# Patient Record
Sex: Male | Born: 1945 | Race: White | Hispanic: No | Smoking: Never smoker
Health system: Southern US, Community
[De-identification: ages and names within clinical notes are randomized; demographics above are authoritative.]

## PROBLEM LIST (undated history)

## (undated) DIAGNOSIS — F32A Depression, unspecified: Secondary | ICD-10-CM

## (undated) DIAGNOSIS — J449 Chronic obstructive pulmonary disease, unspecified: Secondary | ICD-10-CM

## (undated) DIAGNOSIS — I10 Essential (primary) hypertension: Secondary | ICD-10-CM

## (undated) DIAGNOSIS — G8929 Other chronic pain: Secondary | ICD-10-CM

## (undated) DIAGNOSIS — F329 Major depressive disorder, single episode, unspecified: Secondary | ICD-10-CM

## (undated) DIAGNOSIS — F419 Anxiety disorder, unspecified: Secondary | ICD-10-CM

## (undated) DIAGNOSIS — H547 Unspecified visual loss: Secondary | ICD-10-CM

## (undated) DIAGNOSIS — M549 Dorsalgia, unspecified: Secondary | ICD-10-CM

## (undated) HISTORY — PX: BACK SURGERY: SHX140

## (undated) HISTORY — PX: HAND SURGERY: SHX662

## (undated) HISTORY — PX: TONSILLECTOMY: SUR1361

## (undated) HISTORY — PX: ROTATOR CUFF REPAIR: SHX139

---

## 2002-01-28 ENCOUNTER — Encounter: Payer: Self-pay | Admitting: Emergency Medicine

## 2002-01-28 ENCOUNTER — Emergency Department (HOSPITAL_COMMUNITY): Admission: EM | Admit: 2002-01-28 | Discharge: 2002-01-28 | Payer: Self-pay | Admitting: Emergency Medicine

## 2006-09-19 ENCOUNTER — Emergency Department (HOSPITAL_COMMUNITY): Admission: EM | Admit: 2006-09-19 | Discharge: 2006-09-19 | Payer: Self-pay | Admitting: Emergency Medicine

## 2008-09-09 ENCOUNTER — Encounter
Admission: RE | Admit: 2008-09-09 | Discharge: 2008-12-08 | Payer: Self-pay | Admitting: Physical Medicine & Rehabilitation

## 2008-10-28 ENCOUNTER — Ambulatory Visit: Payer: Self-pay | Admitting: Physical Medicine & Rehabilitation

## 2008-12-02 ENCOUNTER — Ambulatory Visit: Payer: Self-pay | Admitting: Physical Medicine & Rehabilitation

## 2008-12-31 ENCOUNTER — Encounter
Admission: RE | Admit: 2008-12-31 | Discharge: 2009-01-21 | Payer: Self-pay | Admitting: Physical Medicine & Rehabilitation

## 2009-01-01 ENCOUNTER — Ambulatory Visit: Payer: Self-pay | Admitting: Physical Medicine & Rehabilitation

## 2009-02-02 ENCOUNTER — Encounter
Admission: RE | Admit: 2009-02-02 | Discharge: 2009-05-03 | Payer: Self-pay | Admitting: Physical Medicine & Rehabilitation

## 2009-02-03 ENCOUNTER — Ambulatory Visit: Payer: Self-pay | Admitting: Physical Medicine & Rehabilitation

## 2009-03-04 ENCOUNTER — Ambulatory Visit: Payer: Self-pay | Admitting: Physical Medicine & Rehabilitation

## 2009-04-01 ENCOUNTER — Ambulatory Visit: Payer: Self-pay | Admitting: Physical Medicine & Rehabilitation

## 2009-04-27 ENCOUNTER — Ambulatory Visit: Payer: Self-pay | Admitting: Physical Medicine & Rehabilitation

## 2010-11-28 ENCOUNTER — Encounter: Payer: Self-pay | Admitting: *Deleted

## 2010-11-28 ENCOUNTER — Other Ambulatory Visit: Payer: Self-pay

## 2010-11-28 ENCOUNTER — Emergency Department (HOSPITAL_COMMUNITY): Payer: Medicare Other

## 2010-11-28 ENCOUNTER — Emergency Department (HOSPITAL_COMMUNITY)
Admission: EM | Admit: 2010-11-28 | Discharge: 2010-11-28 | Disposition: A | Discharge status: Home / Self Care | Payer: Medicare Other | Attending: Emergency Medicine | Admitting: Emergency Medicine

## 2010-11-28 DIAGNOSIS — G8929 Other chronic pain: Secondary | ICD-10-CM | POA: Insufficient documentation

## 2010-11-28 DIAGNOSIS — I10 Essential (primary) hypertension: Secondary | ICD-10-CM | POA: Insufficient documentation

## 2010-11-28 DIAGNOSIS — R42 Dizziness and giddiness: Secondary | ICD-10-CM | POA: Insufficient documentation

## 2010-11-28 DIAGNOSIS — F19939 Other psychoactive substance use, unspecified with withdrawal, unspecified: Secondary | ICD-10-CM | POA: Insufficient documentation

## 2010-11-28 DIAGNOSIS — R531 Weakness: Secondary | ICD-10-CM

## 2010-11-28 DIAGNOSIS — F1123 Opioid dependence with withdrawal: Secondary | ICD-10-CM

## 2010-11-28 DIAGNOSIS — R5381 Other malaise: Secondary | ICD-10-CM | POA: Insufficient documentation

## 2010-11-28 DIAGNOSIS — J45909 Unspecified asthma, uncomplicated: Secondary | ICD-10-CM | POA: Insufficient documentation

## 2010-11-28 DIAGNOSIS — F1120 Opioid dependence, uncomplicated: Secondary | ICD-10-CM | POA: Insufficient documentation

## 2010-11-28 DIAGNOSIS — F411 Generalized anxiety disorder: Secondary | ICD-10-CM | POA: Insufficient documentation

## 2010-11-28 DIAGNOSIS — H5440 Blindness, one eye, unspecified eye: Secondary | ICD-10-CM | POA: Insufficient documentation

## 2010-11-28 HISTORY — DX: Anxiety disorder, unspecified: F41.9

## 2010-11-28 HISTORY — DX: Other chronic pain: G89.29

## 2010-11-28 HISTORY — DX: Essential (primary) hypertension: I10

## 2010-11-28 HISTORY — DX: Chronic obstructive pulmonary disease, unspecified: J44.9

## 2010-11-28 HISTORY — DX: Unspecified visual loss: H54.7

## 2010-11-28 LAB — RAPID URINE DRUG SCREEN, HOSP PERFORMED
Amphetamines: NOT DETECTED
Barbiturates: NOT DETECTED
Benzodiazepines: POSITIVE — AB
Cocaine: NOT DETECTED
Opiates: NOT DETECTED
Tetrahydrocannabinol: NOT DETECTED

## 2010-11-28 LAB — CBC
Platelets: 213 10*3/uL (ref 150–400)
RBC: 5.07 MIL/uL (ref 4.22–5.81)
RDW: 14 % (ref 11.5–15.5)
WBC: 7 10*3/uL (ref 4.0–10.5)

## 2010-11-28 LAB — DIFFERENTIAL
Basophils Absolute: 0 10*3/uL (ref 0.0–0.1)
Eosinophils Absolute: 0.7 10*3/uL (ref 0.0–0.7)
Eosinophils Relative: 10 % — ABNORMAL HIGH (ref 0–5)
Lymphocytes Relative: 35 % (ref 12–46)
Lymphs Abs: 2.5 10*3/uL (ref 0.7–4.0)
Neutrophils Relative %: 46 % (ref 43–77)

## 2010-11-28 LAB — BASIC METABOLIC PANEL
Calcium: 9 mg/dL (ref 8.4–10.5)
Creatinine, Ser: 1.57 mg/dL — ABNORMAL HIGH (ref 0.50–1.35)
GFR calc non Af Amer: 45 mL/min — ABNORMAL LOW (ref >90)
Sodium: 136 mEq/L (ref 135–145)

## 2010-11-28 LAB — URINALYSIS, ROUTINE W REFLEX MICROSCOPIC
Glucose, UA: NEGATIVE mg/dL
Ketones, ur: NEGATIVE mg/dL
Leukocytes, UA: NEGATIVE
Nitrite: NEGATIVE
Protein, ur: NEGATIVE mg/dL
pH: 5.5 (ref 5.0–8.0)

## 2010-11-28 NOTE — ED Notes (Signed)
Pt stated pain is 10/10, "I just feel weak, my legs give out from under me - if I am on Vicodin I feel good - My doctor took them away from me so I have been buying them on the street".

## 2010-11-28 NOTE — ED Notes (Addendum)
Pt states that "I just don't feel right. I have been feeling like this since I started taking Lyrica and Cymbalta. Dr. Gerilyn Pilgrim took me off my Xanax and hydrocodone last month and then started my new medicine. C/o nausea. States that he could not walk on Saturday and had to crawl." Also c/o dizziness.

## 2010-11-28 NOTE — ED Provider Notes (Signed)
History     CSN: [id number] Arrival date & time: 11/28/2010  7:07 PM    Chief Complaint  Patient presents with  . Dizziness    HPI Pt was seen at 1950.  Per pt, c/o gradual onset and persistence of intermittently "not feeling well" for the past 2 months.  Pt states his symptoms began after his PMD and Pain Management MD took him "off my xanax and vicodin" for his chronic pain.  States he has been taking narcotics for his chronic pain "for the past 40 years."  Describes his symptoms as feeling generally weak, nauseated, "shaky" and having acute flair of his chronic pain "all over my body."  States his symptoms improve when he takes vicodin "that I buy off the street."  Denies vomiting/diarrhea, no CP/SOB, no abd pain, no fevers, no rash, no focal motor weakness, no tingling/numbness in extremities.    Past Medical History  Diagnosis Date  . Chronic pain   . Hypertension   . COPD (chronic obstructive pulmonary disease)   . Asthma   . Anxiety   . Blind     left eye    Past Surgical History  Procedure Date  . Back surgery   . Hand surgery     right  . Rotator cuff repair     right  . Tonsillectomy     History  Substance Use Topics  . Smoking status: Never Smoker   . Smokeless tobacco: Not on file  . Alcohol Use: No      Review of Systems ROS: Statement: All systems negative except as marked or noted in the HPI; Constitutional: Negative for fever and chills. ; ; Eyes: Negative for eye pain, redness and discharge. ; ; ENMT: Negative for ear pain, hoarseness, nasal congestion, sinus pressure and sore throat. ; ; Cardiovascular: Negative for chest pain, palpitations, diaphoresis, dyspnea and peripheral edema. ; ; Respiratory: Negative for cough, wheezing and stridor. ; ; Gastrointestinal: +nausea. Negative for vomiting, diarrhea and abdominal pain, blood in stool, hematemesis, jaundice and rectal bleeding. . ; ; Genitourinary: Negative for dysuria, flank pain and hematuria. ; ;  Musculoskeletal: Negative for back pain and neck pain. Negative for swelling and trauma.; ; Skin: Negative for pruritus, rash, abrasions, blisters, bruising and skin lesion.; ; Neuro: +generalized weakness, shakiness.  Negative for headache, lightheadedness and neck stiffness. Negative for altered level of consciousness , altered mental status, extremity weakness, paresthesias, involuntary movement, seizure and syncope.     Allergies  Review of patient's allergies indicates no known allergies.  Home Medications   Current Outpatient Rx  Name Route Sig Dispense Refill  . DULOXETINE HCL 60 MG PO CPEP Oral Take 60 mg by mouth daily. Had a reaction    . LOSARTAN POTASSIUM-HCTZ 50-12.5 MG PO TABS Oral Take 1 tablet by mouth daily.      . METHYLPREDNISOLONE 4 MG PO KIT Oral Take 4 mg by mouth 2 (two) times daily. Patient stop taking had a reaction     . METHOCARBAMOL 500 MG PO TABS Oral Take 500 mg by mouth 4 (four) times daily. Stop taking had a reaction     . PREGABALIN 75 MG PO CAPS Oral Take 75 mg by mouth 2 (two) times daily. Had a reaction    . TAMSULOSIN HCL 0.4 MG PO CAPS Oral Take 0.4 mg by mouth daily after supper. Had a reaction      BP 124/65  Pulse 83  Temp(Src) 97.4 F (36.3 C) (Oral)  Resp  20  Ht 5' 8.5" (1.74 m)  Wt 180 lb (81.647 kg)  BMI 26.97 kg/m2  SpO2 94%  Physical Exam 1955: Physical examination:  Nursing notes reviewed; Vital signs and O2 SAT reviewed;  Constitutional: Well developed, Well nourished, Well hydrated, In no acute distress; Head:  Normocephalic, atraumatic; Eyes: EOMI, PERRL, No scleral icterus; ENMT: TM's clear bilat, +small superficial abrasion noted on right pinnae, no bleeding or drainage.  Mouth and pharynx normal, Mucous membranes moist; Neck: Supple, Full range of motion, No lymphadenopathy; Cardiovascular: Regular rate and rhythm, No murmur, rub, or gallop; Respiratory: Breath sounds clear & equal bilaterally, No rales, rhonchi, wheezes, or rub,  Normal respiratory effort/excursion; Chest: Nontender, Movement normal; Abdomen: Soft, Nontender, Nondistended, Normal bowel sounds; Genitourinary: No CVA tenderness; Extremities: Pulses normal, No tenderness, No edema, No calf edema or asymmetry.; Neuro: AA&Ox3, Major CN grossly intact.  Speech clear, no facial droop.  Gait steady. No gross focal motor or sensory deficits in extremities.  Gait steady.; Skin: Color normal, Warm, Dry, no rash, no petechiae. Psych:  No SI/HI.    ED Course  Procedures  MDM  MDM Reviewed: nursing note and vitals Interpretation: ECG, labs, x-ray and CT scan    Date: 11/28/2010  Rate: 76  Rhythm: normal sinus rhythm  QRS Axis: normal  Intervals: normal  ST/T Wave abnormalities: normal  Conduction Disutrbances:none  Narrative Interpretation:   Old EKG Reviewed: none available    Results for orders placed during the hospital encounter of 11/28/10  BASIC METABOLIC PANEL      Component Value Range   Sodium 136  135 - 145 (mEq/L)   Potassium 3.8  3.5 - 5.1 (mEq/L)   Chloride 98  96 - 112 (mEq/L)   CO2 32  19 - 32 (mEq/L)   Glucose, Bld 90  70 - 99 (mg/dL)   BUN 23  6 - 23 (mg/dL)   Creatinine, Ser 1.61 (*) 0.50 - 1.35 (mg/dL)   Calcium 9.0  8.4 - 09.6 (mg/dL)   GFR calc non Af Amer 45 (*) >90 (mL/min)   GFR calc Af Amer 52 (*) >90 (mL/min)  CBC      Component Value Range   WBC 7.0  4.0 - 10.5 (K/uL)   RBC 5.07  4.22 - 5.81 (MIL/uL)   Hemoglobin 14.9  13.0 - 17.0 (g/dL)   HCT 04.5  40.9 - 81.1 (%)   MCV 86.6  78.0 - 100.0 (fL)   MCH 29.4  26.0 - 34.0 (pg)   MCHC 33.9  30.0 - 36.0 (g/dL)   RDW 91.4  78.2 - 95.6 (%)   Platelets 213  150 - 400 (K/uL)  DIFFERENTIAL      Component Value Range   Neutrophils Relative 46  43 - 77 (%)   Neutro Abs 3.2  1.7 - 7.7 (K/uL)   Lymphocytes Relative 35  12 - 46 (%)   Lymphs Abs 2.5  0.7 - 4.0 (K/uL)   Monocytes Relative 8  3 - 12 (%)   Monocytes Absolute 0.6  0.1 - 1.0 (K/uL)   Eosinophils Relative 10 (*)  0 - 5 (%)   Eosinophils Absolute 0.7  0.0 - 0.7 (K/uL)   Basophils Relative 1  0 - 1 (%)   Basophils Absolute 0.0  0.0 - 0.1 (K/uL)  URINALYSIS, ROUTINE W REFLEX MICROSCOPIC      Component Value Range   Color, Urine YELLOW  YELLOW    Appearance CLEAR  CLEAR    Specific Gravity, Urine  1.020  1.005 - 1.030    pH 5.5  5.0 - 8.0    Glucose, UA NEGATIVE  NEGATIVE (mg/dL)   Hgb urine dipstick NEGATIVE  NEGATIVE    Bilirubin Urine NEGATIVE  NEGATIVE    Ketones, ur NEGATIVE  NEGATIVE (mg/dL)   Protein, ur NEGATIVE  NEGATIVE (mg/dL)   Urobilinogen, UA 0.2  0.0 - 1.0 (mg/dL)   Nitrite NEGATIVE  NEGATIVE    Leukocytes, UA NEGATIVE  NEGATIVE   URINE RAPID DRUG SCREEN (HOSP PERFORMED)      Component Value Range   Opiates NONE DETECTED  NONE DETECTED    Cocaine NONE DETECTED  NONE DETECTED    Benzodiazepines POSITIVE (*) NONE DETECTED    Amphetamines NONE DETECTED  NONE DETECTED    Tetrahydrocannabinol NONE DETECTED  NONE DETECTED    Barbiturates NONE DETECTED  NONE DETECTED   ETHANOL      Component Value Range   Alcohol, Ethyl (B) <11  0 - 11 (mg/dL)  POCT I-STAT TROPONIN I      Component Value Range   Troponin i, poc 0.01  0.00 - 0.08 (ng/mL)   Comment 3            Dg Chest 2 View  11/28/2010  *RADIOLOGY REPORT*  Clinical Data: Dizziness and weakness.  Rule out infiltrate  CHEST - 2 VIEW  Comparison: 09/19/2006  Findings: Patchy bibasilar airspace disease, not present previously.  This may represent atelectasis or infiltrate. Negative for heart failure or effusion.  Probable COPD.  IMPRESSION: Bibasilar airspace disease, this may represent atelectasis or pneumonia.  Original Report Authenticated By: Camelia Phenes, M.D.   Ct Head Wo Contrast  11/28/2010  *RADIOLOGY REPORT*  Clinical Data: Dizziness and weakness  CT HEAD WITHOUT CONTRAST  Technique:  Contiguous axial images were obtained from the base of the skull through the vertex without contrast.  Comparison: None.  Findings: Age  appropriate atrophy.  Negative for hydrocephalus. Negative for acute infarct.  Negative for hemorrhage or mass lesion.  Calvarium is intact.  IMPRESSION: No acute intracranial abnormality.  Original Report Authenticated By: Camelia Phenes, M.D.   10:48 PM:  Doubt pneumonia on CXR given normal WBC, afebrile, no c/o cough or fevers at home.  Pt up walking around ED exam room, wants to go home now.  Does not want detox for his narcotic dependence, stating "vicodin makes me feel good" and he does not want to stop taking it.  Dx testing d/w pt.  Questions answered.  Verb understanding, agreeable to d/c home with outpt f/u.   Katerin Negrete Allison Quarry, DO 12/01/10 1844

## 2010-11-28 NOTE — ED Notes (Signed)
Pt back in room from radiology.

## 2010-11-28 NOTE — ED Notes (Signed)
Pt out to radiology

## 2010-11-28 NOTE — ED Notes (Signed)
Pt up to bathroom.

## 2010-11-28 NOTE — ED Notes (Signed)
MD at bedside to reassess pt

## 2010-11-28 NOTE — ED Notes (Signed)
MD at bedside. 

## 2010-11-30 LAB — URINE CULTURE: Colony Count: NO GROWTH

## 2011-10-24 ENCOUNTER — Other Ambulatory Visit (HOSPITAL_COMMUNITY): Payer: Self-pay | Admitting: Family Medicine

## 2011-10-24 ENCOUNTER — Ambulatory Visit (HOSPITAL_COMMUNITY)
Admission: RE | Admit: 2011-10-24 | Discharge: 2011-10-24 | Disposition: A | Discharge status: Home / Self Care | Payer: Medicare Other | Source: Ambulatory Visit | Attending: Family Medicine | Admitting: Family Medicine

## 2011-10-24 DIAGNOSIS — R0600 Dyspnea, unspecified: Secondary | ICD-10-CM

## 2011-10-24 DIAGNOSIS — J40 Bronchitis, not specified as acute or chronic: Secondary | ICD-10-CM

## 2011-10-24 DIAGNOSIS — R0609 Other forms of dyspnea: Secondary | ICD-10-CM | POA: Insufficient documentation

## 2011-10-24 DIAGNOSIS — R0989 Other specified symptoms and signs involving the circulatory and respiratory systems: Secondary | ICD-10-CM | POA: Insufficient documentation

## 2011-11-06 ENCOUNTER — Encounter (INDEPENDENT_AMBULATORY_CARE_PROVIDER_SITE_OTHER): Payer: Self-pay | Admitting: *Deleted

## 2011-11-15 ENCOUNTER — Encounter (INDEPENDENT_AMBULATORY_CARE_PROVIDER_SITE_OTHER): Payer: Self-pay | Admitting: *Deleted

## 2011-11-15 ENCOUNTER — Telehealth (INDEPENDENT_AMBULATORY_CARE_PROVIDER_SITE_OTHER): Payer: Self-pay | Admitting: *Deleted

## 2011-11-15 ENCOUNTER — Other Ambulatory Visit (INDEPENDENT_AMBULATORY_CARE_PROVIDER_SITE_OTHER): Payer: Self-pay | Admitting: *Deleted

## 2011-11-15 DIAGNOSIS — Z860100 Personal history of colon polyps, unspecified: Secondary | ICD-10-CM

## 2011-11-15 DIAGNOSIS — Z1211 Encounter for screening for malignant neoplasm of colon: Secondary | ICD-10-CM

## 2011-11-15 MED ORDER — PEG-KCL-NACL-NASULF-NA ASC-C 100 G PO SOLR: 1.0000 | Freq: Once | ORAL | Status: DC

## 2011-11-15 NOTE — Telephone Encounter (Signed)
Patient needs movi prep 

## 2011-12-20 ENCOUNTER — Telehealth (INDEPENDENT_AMBULATORY_CARE_PROVIDER_SITE_OTHER): Payer: Self-pay | Admitting: *Deleted

## 2011-12-20 NOTE — Telephone Encounter (Signed)
  Procedure: tcs  Reason/Indication:  Hx polyps  Has patient had this procedure before?  yes  If so, when, by whom and where?  Less than 10 yrs  Is there a family history of colon cancer?  no  Who?  What age when diagnosed?    Is patient diabetic?   no      Does patient have prosthetic heart valve?  no  Do you have a pacemaker?  no  Has patient had joint replacement within last 12 months?  no  Is patient on Coumadin, Plavix and/or Aspirin? no  Medications: tramadol 50 mg 1-2 tab Q 6 hrs, crestor 20 mg daily, ranitidine 150 mg bid, hydroxyzine 25 mg tid, tamsulosine 0.4 mg daily, diclofenac 50 mg 1 tab Q 12 hrs, losartan/hctz 50/12.5 mg daily, xanax 1 mg 1 tab Q 4 hrs, pro-air inhaler 90 mcg  Allergies: steroids  Medication Adjustment:   Procedure date & time: 01/17/12 at 1200

## 2011-12-20 NOTE — Telephone Encounter (Signed)
agree

## 2011-12-27 ENCOUNTER — Encounter (HOSPITAL_COMMUNITY): Payer: Self-pay | Admitting: Pharmacy Technician

## 2012-01-02 ENCOUNTER — Encounter (HOSPITAL_COMMUNITY): Payer: Self-pay | Admitting: Pharmacy Technician

## 2012-02-15 ENCOUNTER — Telehealth (INDEPENDENT_AMBULATORY_CARE_PROVIDER_SITE_OTHER): Payer: Self-pay | Admitting: *Deleted

## 2012-02-15 NOTE — Telephone Encounter (Signed)
  Procedure: tcs  Reason/Indication:  Hx polyps  Has patient had this procedure before?  yes  If so, when, by whom and where?  Less than 10 yrs ago  Is there a family history of colon cancer?  np  Who?  What age when diagnosed?    Is patient diabetic?   no      Does patient have prosthetic heart valve?  no  Do you have a pacemaker?  no  Has patient had joint replacement within last 12 months?  no  Is patient on Coumadin, Plavix and/or Aspirin? no  Medications: tramadol 50 mg 1-2 tab Q 6 hrs, crestor 20 mg daily, ranitidine 150 mg bid, hydroxyzine 25 mg tid, tamsulosine 0.4 mg daily, diclofenac 50 mg 1 tab Q 12 hrs, losartan/hctz 50/12.5 mg daily, xanax 1 mg 1 tab Q 4 hrs, pro-air inhaler 90 mcg  Allergies: steroids  Medication Adjustment:   Procedure date & time: 03/13/12 at 930

## 2012-02-15 NOTE — Telephone Encounter (Signed)
agree

## 2012-03-12 ENCOUNTER — Other Ambulatory Visit (INDEPENDENT_AMBULATORY_CARE_PROVIDER_SITE_OTHER): Payer: Self-pay | Admitting: *Deleted

## 2012-03-12 ENCOUNTER — Encounter (INDEPENDENT_AMBULATORY_CARE_PROVIDER_SITE_OTHER): Payer: Self-pay | Admitting: *Deleted

## 2012-03-12 DIAGNOSIS — Z86010 Personal history of colonic polyps: Secondary | ICD-10-CM

## 2012-03-20 ENCOUNTER — Telehealth (INDEPENDENT_AMBULATORY_CARE_PROVIDER_SITE_OTHER): Payer: Self-pay | Admitting: *Deleted

## 2012-03-20 NOTE — Telephone Encounter (Signed)
agree

## 2012-03-20 NOTE — Telephone Encounter (Signed)
  Procedure: tcs  Reason/Indication:  Hx polyps  Has patient had this procedure before?  Yes, less than 10 yrs ago   If so, when, by whom and where?    Is there a family history of colon cancer?  no  Who?  What age when diagnosed?    Is patient diabetic?   no      Does patient have prosthetic heart valve?  no  Do you have a pacemaker?  no  Has patient had joint replacement within last 12 months?  no  Is patient on Coumadin, Plavix and/or Aspirin? no  Medications: tramadol 50 mg 1-2 tab Q 6 hours, crestor 20 mg daily, ranitidine 150 mg bid, hydroxyzine 25 mg tid, tamsulosin 0.4 mg daily, diclofenac 50 mg tab 1 tab Q 12 hours, losartan/hctz 50/12.5 mg daily, xanax 1 mg 1 tab Q 4 hours, pro-air inhaler 90 mcg  Allergies: steroids  Medication Adjustment:   Procedure date & time: 04/18/12 at 1030

## 2012-04-17 MED ORDER — SODIUM CHLORIDE 0.45 % IV SOLN: INTRAVENOUS | Status: DC

## 2012-04-18 ENCOUNTER — Ambulatory Visit (HOSPITAL_COMMUNITY)
Admission: RE | Admit: 2012-04-18 | Discharge: 2012-04-18 | Disposition: A | Discharge status: Home / Self Care | Payer: Medicare Other | Source: Ambulatory Visit | Attending: Internal Medicine | Admitting: Internal Medicine

## 2012-04-18 ENCOUNTER — Encounter (HOSPITAL_COMMUNITY): Payer: Self-pay | Admitting: *Deleted

## 2012-04-18 ENCOUNTER — Encounter (HOSPITAL_COMMUNITY)
Admission: RE | Disposition: A | Discharge status: Home / Self Care | Payer: Self-pay | Source: Ambulatory Visit | Attending: Internal Medicine

## 2012-04-18 DIAGNOSIS — D126 Benign neoplasm of colon, unspecified: Secondary | ICD-10-CM | POA: Insufficient documentation

## 2012-04-18 DIAGNOSIS — D128 Benign neoplasm of rectum: Secondary | ICD-10-CM | POA: Insufficient documentation

## 2012-04-18 DIAGNOSIS — I10 Essential (primary) hypertension: Secondary | ICD-10-CM | POA: Insufficient documentation

## 2012-04-18 DIAGNOSIS — J4489 Other specified chronic obstructive pulmonary disease: Secondary | ICD-10-CM | POA: Insufficient documentation

## 2012-04-18 DIAGNOSIS — H5440 Blindness, one eye, unspecified eye: Secondary | ICD-10-CM | POA: Insufficient documentation

## 2012-04-18 DIAGNOSIS — M549 Dorsalgia, unspecified: Secondary | ICD-10-CM | POA: Insufficient documentation

## 2012-04-18 DIAGNOSIS — Z860100 Personal history of colon polyps, unspecified: Secondary | ICD-10-CM

## 2012-04-18 DIAGNOSIS — G8929 Other chronic pain: Secondary | ICD-10-CM | POA: Insufficient documentation

## 2012-04-18 DIAGNOSIS — F329 Major depressive disorder, single episode, unspecified: Secondary | ICD-10-CM | POA: Insufficient documentation

## 2012-04-18 DIAGNOSIS — F411 Generalized anxiety disorder: Secondary | ICD-10-CM | POA: Insufficient documentation

## 2012-04-18 DIAGNOSIS — J449 Chronic obstructive pulmonary disease, unspecified: Secondary | ICD-10-CM | POA: Insufficient documentation

## 2012-04-18 DIAGNOSIS — F3289 Other specified depressive episodes: Secondary | ICD-10-CM | POA: Insufficient documentation

## 2012-04-18 DIAGNOSIS — D129 Benign neoplasm of anus and anal canal: Secondary | ICD-10-CM

## 2012-04-18 DIAGNOSIS — K644 Residual hemorrhoidal skin tags: Secondary | ICD-10-CM | POA: Insufficient documentation

## 2012-04-18 DIAGNOSIS — Z1211 Encounter for screening for malignant neoplasm of colon: Secondary | ICD-10-CM

## 2012-04-18 DIAGNOSIS — Z79899 Other long term (current) drug therapy: Secondary | ICD-10-CM | POA: Insufficient documentation

## 2012-04-18 HISTORY — DX: Major depressive disorder, single episode, unspecified: F32.9

## 2012-04-18 HISTORY — DX: Dorsalgia, unspecified: M54.9

## 2012-04-18 HISTORY — DX: Other chronic pain: G89.29

## 2012-04-18 HISTORY — PX: COLONOSCOPY: SHX5424

## 2012-04-18 HISTORY — DX: Depression, unspecified: F32.A

## 2012-04-18 SURGERY — COLONOSCOPY
Anesthesia: Moderate Sedation

## 2012-04-18 MED ORDER — MEPERIDINE HCL 50 MG/ML IJ SOLN
INTRAMUSCULAR | Status: DC | PRN
  Administered 2012-04-18 (×2): 25 mg via INTRAVENOUS

## 2012-04-18 MED ORDER — MEPERIDINE HCL 50 MG/ML IJ SOLN
INTRAMUSCULAR | Status: AC
  Filled 2012-04-18: qty 1

## 2012-04-18 MED ORDER — MIDAZOLAM HCL 5 MG/5ML IJ SOLN
INTRAMUSCULAR | Status: AC
  Filled 2012-04-18: qty 10

## 2012-04-18 MED ORDER — MIDAZOLAM HCL 5 MG/5ML IJ SOLN
INTRAMUSCULAR | Status: DC | PRN
  Administered 2012-04-18 (×3): 2 mg via INTRAVENOUS
  Administered 2012-04-18 (×3): 3 mg via INTRAVENOUS

## 2012-04-18 MED ORDER — MIDAZOLAM HCL 5 MG/5ML IJ SOLN
INTRAMUSCULAR | Status: AC
  Filled 2012-04-18: qty 5

## 2012-04-18 MED ORDER — PROMETHAZINE HCL 25 MG/ML IJ SOLN
INTRAMUSCULAR | Status: DC | PRN
  Administered 2012-04-18: 25 mg via INTRAVENOUS

## 2012-04-18 MED ORDER — SODIUM CHLORIDE 0.9 % IJ SOLN
INTRAMUSCULAR | Status: AC
  Filled 2012-04-18: qty 10

## 2012-04-18 MED ORDER — PROMETHAZINE HCL 25 MG/ML IJ SOLN
INTRAMUSCULAR | Status: AC
  Filled 2012-04-18: qty 1

## 2012-04-18 MED ORDER — SODIUM CHLORIDE 0.9 % IV SOLN
INTRAVENOUS | Status: DC
  Administered 2012-04-18: 10:00:00 via INTRAVENOUS

## 2012-04-18 NOTE — H&P (Signed)
Steven Ayala is an 67 y.o. male.   Chief Complaint: Patient is here for colonoscopy. HPI: Patient 67 year old Caucasian male with history of colonic polyps and is here for surveillance colonoscopy. He had polyps removed on his first colonoscopy but 15 years ago none on his last exam which is +10 years ago elsewhere. He denies abdominal pain change in his bowel habits or rectal bleeding. Family history is negative for CRC.  Past Medical History  Diagnosis Date  . Chronic pain   . Hypertension   . COPD (chronic obstructive pulmonary disease)   . Asthma   . Anxiety   . Blind     left eye  . Depression   . Chronic back pain     Past Surgical History  Procedure Laterality Date  . Back surgery    . Hand surgery      right  . Rotator cuff repair      right  . Tonsillectomy      Family History  Problem Relation Age of Onset  . Colon cancer Neg Hx    Social History:  reports that he has never smoked. He does not have any smokeless tobacco history on file. He reports that he uses illicit drugs. He reports that he does not drink alcohol.  Allergies: No Known Allergies  Medications Prior to Admission  Medication Sig Dispense Refill  . ALPRAZolam (XANAX) 1 MG tablet Take 1 mg by mouth 3 (three) times daily as needed.      Marland Kitchen amLODipine (NORVASC) 5 MG tablet Take 5 mg by mouth daily.      . DULoxetine (CYMBALTA) 60 MG capsule Take 60 mg by mouth daily.       Marland Kitchen HYDROcodone-acetaminophen (NORCO) 10-325 MG per tablet Take 1 tablet by mouth every 6 (six) hours as needed. Pain      . losartan-hydrochlorothiazide (HYZAAR) 50-12.5 MG per tablet Take 1 tablet by mouth daily.        . methocarbamol (ROBAXIN) 500 MG tablet Take 500 mg by mouth 4 (four) times daily.       . polyethylene glycol (GOLYTELY/NULYTELY) 236 G solution Take by mouth once.      . ranitidine (ZANTAC) 150 MG tablet Take 150 mg by mouth 2 (two) times daily.      . Tamsulosin HCl (FLOMAX) 0.4 MG CAPS Take 0.4 mg by mouth  daily after supper.       Marland Kitchen albuterol (PROVENTIL HFA;VENTOLIN HFA) 108 (90 BASE) MCG/ACT inhaler Inhale 2 puffs into the lungs every 6 (six) hours as needed. Shortness of Breath      . peg 3350 powder (MOVIPREP) 100 G SOLR Take 1 kit (100 g total) by mouth once.  1 kit  0    No results found for this or any previous visit (from the past 48 hour(s)). No results found.  ROS  Blood pressure 151/79, pulse 74, temperature 97.5 F (36.4 C), temperature source Oral, resp. rate 22, height 5\' 8"  (1.727 m), weight 185 lb (83.915 kg), SpO2 93.00%. Physical Exam  Constitutional: He appears well-developed and well-nourished.  HENT:  Mouth/Throat: Oropharynx is clear and moist.  Eyes: Conjunctivae are normal. No scleral icterus.  Neck: No thyromegaly present.  Cardiovascular: Normal rate, regular rhythm and normal heart sounds.   No murmur heard. Respiratory: Effort normal and breath sounds normal.  GI: Soft. He exhibits no distension and no mass. There is no tenderness.  Musculoskeletal: He exhibits no edema.  Lymphadenopathy:    He has  no cervical adenopathy.  Neurological: He is alert.  Skin: Skin is warm and dry.     Assessment/Plan History of colonic polyps. Surveillance colonoscopy.  Gionna Polak U 04/18/2012, 10:48 AM

## 2012-04-18 NOTE — Op Note (Signed)
COLONOSCOPY PROCEDURE REPORT  PATIENT:  Steven Ayala  MR#:  [id number] Birthdate:  25-Apr-1945, 67 y.o., male Endoscopist:  Dr. Malissa Hippo, MD Referred By:  Dr. Ishmael Holter. McInnis, and Procedure Date: 04/18/2012  Procedure:   Colonoscopy with snare polypectomy  Indications: Patient is 67 year old Caucasian male with history of colonic adenomas. He is undergoing surveillance colonoscopy.  Informed Consent:  The procedure and risks were reviewed with the patient and informed consent was obtained.  Medications:  Demerol 50 mg IV Versed 15 mg IV Promethazine 25 mg IV in diluted form.  Description of procedure:  After a digital rectal exam was performed, that colonoscope was advanced from the anus through the rectum and colon to the area of the cecum, ileocecal valve and appendiceal orifice. The cecum was deeply intubated. These structures were well-seen and photographed for the record. From the level of the cecum and ileocecal valve, the scope was slowly and cautiously withdrawn. The mucosal surfaces were carefully surveyed utilizing scope tip to flexion to facilitate fold flattening as needed. The scope was pulled down into the rectum where a thorough exam including retroflexion was performed.  Findings:   Prep satisfactory. Small polyp ablated via cold biopsy from mid transverse colon. 12  mm polyp snared from rectum. Small hemorrhoids below the dentate line.   Therapeutic/Diagnostic Maneuvers Performed:  See above  Complications:  None  Cecal Withdrawal Time:  13 minutes  Impression:  Examination performed to cecum. Small polyp ablated via cold biopsy from the transverse colon. 12 mm polyp snared from rectum. Small external hemorrhoids.  Recommendations:  Standard instructions given. I will contact patient with biopsy results and further recommendations.  REHMAN,NAJEEB U  04/18/2012 11:38 AM  CC: Dr. Alice Reichert, MD & Dr. Bonnetta Barry ref. provider found

## 2012-04-22 ENCOUNTER — Encounter (INDEPENDENT_AMBULATORY_CARE_PROVIDER_SITE_OTHER): Payer: Self-pay | Admitting: *Deleted

## 2012-04-22 ENCOUNTER — Encounter (HOSPITAL_COMMUNITY): Payer: Self-pay | Admitting: Internal Medicine

## 2012-04-23 ENCOUNTER — Encounter (INDEPENDENT_AMBULATORY_CARE_PROVIDER_SITE_OTHER): Payer: Self-pay | Admitting: *Deleted

## 2013-04-11 ENCOUNTER — Other Ambulatory Visit (HOSPITAL_COMMUNITY): Payer: Self-pay | Admitting: Family Medicine

## 2013-04-11 ENCOUNTER — Ambulatory Visit (HOSPITAL_COMMUNITY)
Admission: RE | Admit: 2013-04-11 | Discharge: 2013-04-11 | Disposition: A | Discharge status: Home / Self Care | Payer: Medicare Other | Source: Ambulatory Visit | Attending: Family Medicine | Admitting: Family Medicine

## 2013-04-11 DIAGNOSIS — M79605 Pain in left leg: Principal | ICD-10-CM

## 2013-04-11 DIAGNOSIS — R609 Edema, unspecified: Secondary | ICD-10-CM

## 2013-04-11 DIAGNOSIS — M79604 Pain in right leg: Secondary | ICD-10-CM

## 2013-04-11 DIAGNOSIS — M79609 Pain in unspecified limb: Secondary | ICD-10-CM | POA: Insufficient documentation

## 2013-07-02 ENCOUNTER — Ambulatory Visit (HOSPITAL_COMMUNITY)
Admission: RE | Admit: 2013-07-02 | Discharge: 2013-07-02 | Disposition: A | Discharge status: Home / Self Care | Payer: Medicare Other | Source: Ambulatory Visit | Attending: Family Medicine | Admitting: Family Medicine

## 2013-07-02 ENCOUNTER — Other Ambulatory Visit (HOSPITAL_COMMUNITY): Payer: Self-pay | Admitting: Family Medicine

## 2013-07-02 DIAGNOSIS — M542 Cervicalgia: Secondary | ICD-10-CM

## 2013-07-02 DIAGNOSIS — M47812 Spondylosis without myelopathy or radiculopathy, cervical region: Secondary | ICD-10-CM | POA: Insufficient documentation

## 2013-09-18 ENCOUNTER — Other Ambulatory Visit (HOSPITAL_COMMUNITY): Payer: Self-pay | Admitting: Family Medicine

## 2013-09-18 ENCOUNTER — Ambulatory Visit (HOSPITAL_COMMUNITY)
Admission: RE | Admit: 2013-09-18 | Discharge: 2013-09-18 | Disposition: A | Discharge status: Home / Self Care | Payer: Medicare Other | Source: Ambulatory Visit | Attending: Family Medicine | Admitting: Family Medicine

## 2013-09-18 DIAGNOSIS — R1011 Right upper quadrant pain: Secondary | ICD-10-CM

## 2013-09-18 DIAGNOSIS — I7090 Unspecified atherosclerosis: Secondary | ICD-10-CM | POA: Diagnosis not present

## 2013-09-18 DIAGNOSIS — R1909 Other intra-abdominal and pelvic swelling, mass and lump: Secondary | ICD-10-CM | POA: Insufficient documentation

## 2013-09-18 DIAGNOSIS — R932 Abnormal findings on diagnostic imaging of liver and biliary tract: Secondary | ICD-10-CM | POA: Diagnosis not present

## 2013-09-18 DIAGNOSIS — R1901 Right upper quadrant abdominal swelling, mass and lump: Secondary | ICD-10-CM

## 2013-09-18 MED ORDER — IOHEXOL 300 MG/ML  SOLN
100.0000 mL | Freq: Once | INTRAMUSCULAR | Status: AC | PRN
  Administered 2013-09-18: 100 mL via INTRAVENOUS

## 2013-09-23 ENCOUNTER — Other Ambulatory Visit (HOSPITAL_COMMUNITY): Payer: Self-pay | Admitting: General Surgery

## 2013-09-23 DIAGNOSIS — R1011 Right upper quadrant pain: Secondary | ICD-10-CM

## 2013-09-29 ENCOUNTER — Ambulatory Visit (HOSPITAL_COMMUNITY)
Admission: RE | Admit: 2013-09-29 | Discharge: 2013-09-29 | Disposition: A | Discharge status: Home / Self Care | Payer: Medicare Other | Source: Ambulatory Visit | Attending: General Surgery | Admitting: General Surgery

## 2013-09-29 DIAGNOSIS — R1011 Right upper quadrant pain: Secondary | ICD-10-CM | POA: Diagnosis not present

## 2013-10-02 ENCOUNTER — Other Ambulatory Visit (HOSPITAL_COMMUNITY): Payer: Self-pay | Admitting: General Surgery

## 2013-10-02 DIAGNOSIS — K829 Disease of gallbladder, unspecified: Secondary | ICD-10-CM

## 2013-10-02 DIAGNOSIS — R110 Nausea: Secondary | ICD-10-CM

## 2013-10-02 DIAGNOSIS — R1011 Right upper quadrant pain: Secondary | ICD-10-CM

## 2013-10-07 ENCOUNTER — Encounter (HOSPITAL_COMMUNITY): Payer: Self-pay

## 2013-10-07 ENCOUNTER — Encounter (HOSPITAL_COMMUNITY)
Admission: RE | Admit: 2013-10-07 | Discharge: 2013-10-07 | Disposition: A | Discharge status: Home / Self Care | Payer: Medicare Other | Source: Ambulatory Visit | Attending: Diagnostic Radiology | Admitting: Diagnostic Radiology

## 2013-10-07 DIAGNOSIS — R1011 Right upper quadrant pain: Secondary | ICD-10-CM | POA: Diagnosis present

## 2013-10-07 DIAGNOSIS — R110 Nausea: Secondary | ICD-10-CM | POA: Diagnosis not present

## 2013-10-07 DIAGNOSIS — K829 Disease of gallbladder, unspecified: Secondary | ICD-10-CM

## 2013-10-07 MED ORDER — SODIUM CHLORIDE 0.9 % IJ SOLN
INTRAMUSCULAR | Status: AC
  Filled 2013-10-07: qty 30

## 2013-10-07 MED ORDER — TECHNETIUM TC 99M MEBROFENIN IV KIT
5.0000 | PACK | Freq: Once | INTRAVENOUS | Status: AC | PRN
  Administered 2013-10-07: 5 via INTRAVENOUS

## 2013-10-07 MED ORDER — STERILE WATER FOR INJECTION IJ SOLN
INTRAMUSCULAR | Status: AC
  Administered 2013-10-07: 5 mL via INTRAVENOUS
  Filled 2013-10-07: qty 10

## 2013-10-07 MED ORDER — SINCALIDE 5 MCG IJ SOLR
INTRAMUSCULAR | Status: AC
  Administered 2013-10-07: 1.68 ug via INTRAVENOUS
  Filled 2013-10-07: qty 5

## 2014-02-21 IMAGING — CR DG CHEST 2V
2 series · 2 of 2 positions shown · non-contrast
Comparison: 11/28/2010

CLINICAL DATA: Dyspnea.  Asthma.  Bronchitis.

CHEST - 2 VIEW

[view not recorded (1 of 2)]
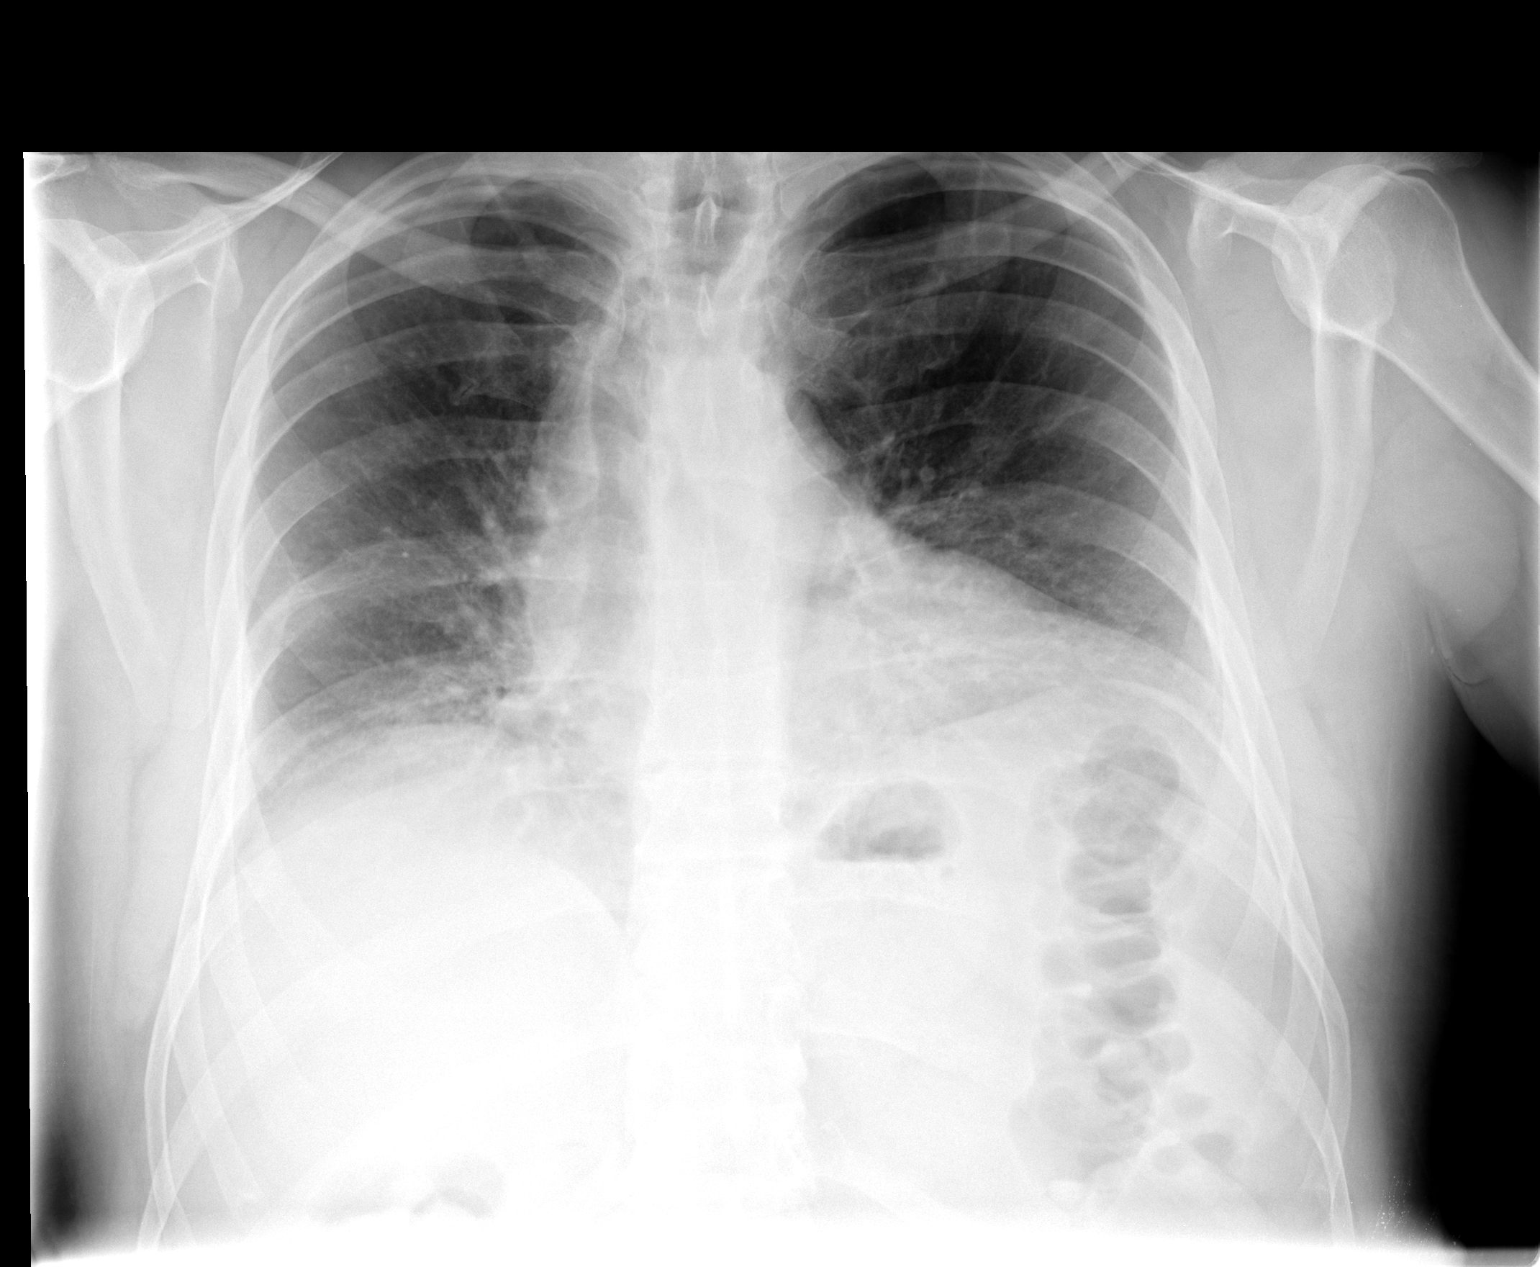

[view not recorded (2 of 2)]
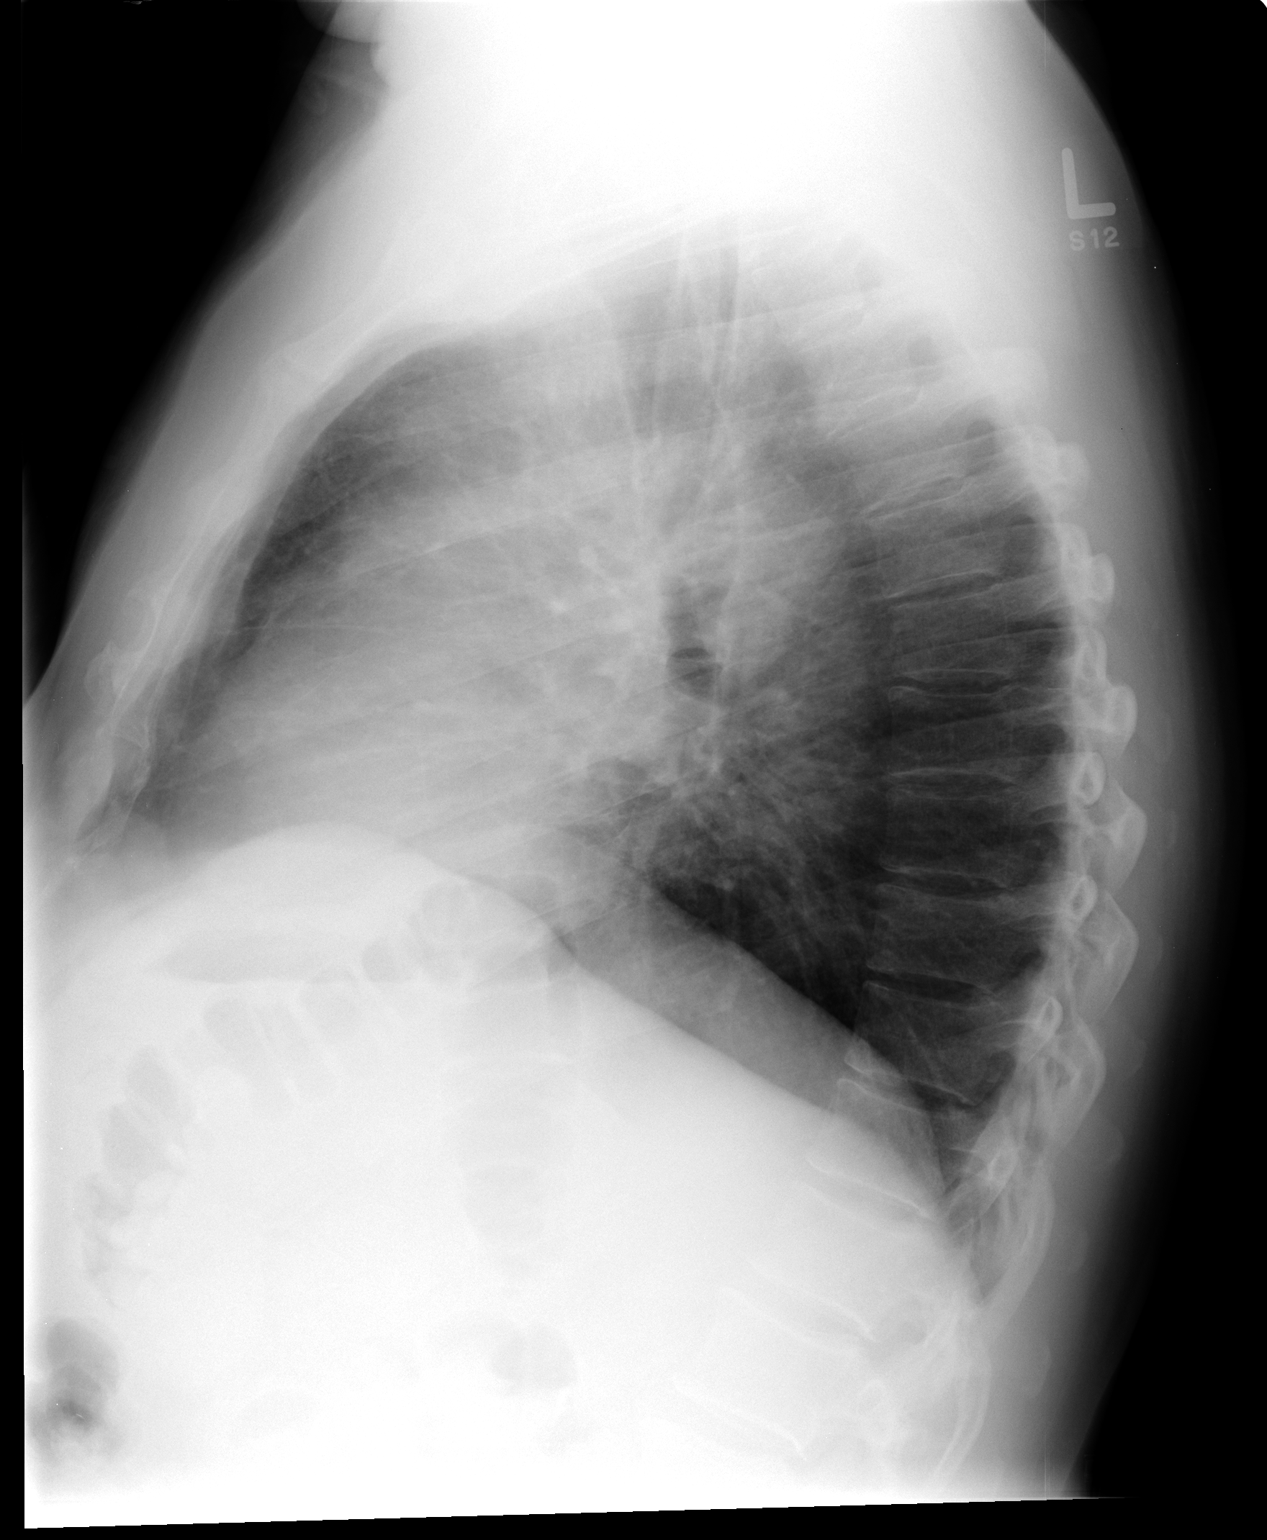

[2 of 2 positions shown; findings below may reference images not displayed]

FINDINGS: Mild hyperinflation on the lateral view. Poor inspiratory
effort on the frontal view.  Patient is rotated right.  Heart size
accentuated by low lung volumes. No pleural effusion or
pneumothorax.  Right greater than left bibasilar airspace disease
is slightly increased since 5925.
IMPRESSION: Low lung volumes on the frontal with bibasilar airspace disease,
likely atelectasis..  This is slightly increased since 11/28/2010.

## 2015-10-21 ENCOUNTER — Other Ambulatory Visit: Payer: Self-pay

## 2015-10-21 DIAGNOSIS — G4730 Sleep apnea, unspecified: Secondary | ICD-10-CM

## 2015-10-21 DIAGNOSIS — R0683 Snoring: Secondary | ICD-10-CM

## 2015-10-28 ENCOUNTER — Ambulatory Visit: Payer: Medicare Other | Attending: Anesthesiology | Admitting: Neurology

## 2015-10-28 DIAGNOSIS — G4733 Obstructive sleep apnea (adult) (pediatric): Secondary | ICD-10-CM | POA: Diagnosis present

## 2015-10-28 DIAGNOSIS — G4730 Sleep apnea, unspecified: Secondary | ICD-10-CM

## 2015-10-28 DIAGNOSIS — R0683 Snoring: Secondary | ICD-10-CM

## 2015-11-05 NOTE — Procedures (Signed)
  [place] A. Merlene Laughter, MD     [email or web address]             NOCTURNAL POLYSOMNOGRAPHY   LOCATION: [facility]  Patient Name: Steven Ayala, Steven Ayala Date: 10/28/2015 Gender: Male D.O.B: 1945/03/22 Age (years): 86 Referring Provider: Manfred Shirts Height (inches): 68 Interpreting Physician: Phillips Odor MD, ABSM Weight (lbs): 183 RPSGT: Peak, Robert BMI: 28 MRN: Neck Size: 16.00 CLINICAL INFORMATION Sleep Study Type: NPSG Indication for sleep study: Snoring, Witnessed Apneas Epworth Sleepiness Score: 5 SLEEP STUDY TECHNIQUE As per the AASM Manual for the Scoring of Sleep and Associated Events v2.3 (April 2016) with a hypopnea requiring 4% desaturations. The channels recorded and monitored were frontal, central and occipital EEG, electrooculogram (EOG), submentalis EMG (chin), nasal and oral airflow, thoracic and abdominal wall motion, anterior tibialis EMG, snore microphone, electrocardiogram, and pulse oximetry. MEDICATIONS Patient's medications include: N/A. Medications self-administered by patient during sleep study : No sleep medicine administered.  Current Outpatient Prescriptions:  .  albuterol (PROVENTIL HFA;VENTOLIN HFA) 108 (90 BASE) MCG/ACT inhaler, Inhale 2 puffs into the lungs every 6 (six) hours as needed. Shortness of Breath, Disp: , Rfl:  .  ALPRAZolam (XANAX) 1 MG tablet, Take 1 mg by mouth 3 (three) times daily as needed., Disp: , Rfl:  .  amLODipine (NORVASC) 5 MG tablet, Take 5 mg by mouth daily., Disp: , Rfl:  .  DULoxetine (CYMBALTA) 60 MG capsule, Take 60 mg by mouth daily. , Disp: , Rfl:  .  HYDROcodone-acetaminophen (NORCO) 10-325 MG per tablet, Take 1 tablet by mouth every 6 (six) hours as needed. Pain, Disp: , Rfl:  .  losartan-hydrochlorothiazide (HYZAAR) 50-12.5 MG per tablet, Take 1 tablet by mouth daily.  , Disp: , Rfl:  .  methocarbamol (ROBAXIN) 500 MG tablet, Take 500 mg by mouth 4 (four) times daily. , Disp: , Rfl:  .   ranitidine (ZANTAC) 150 MG tablet, Take 150 mg by mouth 2 (two) times daily., Disp: , Rfl:  .  Tamsulosin HCl (FLOMAX) 0.4 MG CAPS, Take 0.4 mg by mouth daily after supper. , Disp: , Rfl:   SLEEP ARCHITECTURE The study was initiated at 10:21:40 PM and ended at 5:10:42 AM. Sleep onset time was 70.6 minutes and the sleep efficiency was 73.0%. The total sleep time was 298.5 minutes. Stage REM latency was 93.5 minutes. The patient spent 11.73% of the night in stage N1 sleep, 68.51% in stage N2 sleep, 0.00% in stage N3 and 19.77% in REM. Alpha intrusion was absent. Supine sleep was 22.78%. RESPIRATORY PARAMETERS The overall apnea/hypopnea index (AHI) was 6.8 per hour. There were 1 total apneas, including 1 obstructive, 0 central and 0 mixed apneas. There were 33 hypopneas and 35 RERAs. The AHI during Stage REM sleep was 16.3 per hour. AHI while supine was 2.6 per hour. The mean oxygen saturation was 91.89%. The minimum SpO2 during sleep was 86.00%. Loud snoring was noted during this study. CARDIAC DATA The 2 lead EKG demonstrated sinus rhythm. The mean heart rate was 56.30 beats per minute. Other EKG findings include: None. LEG MOVEMENT DATA The total PLMS were 152 with a resulting PLMS index of 30.55. Associated arousal with leg movement index was 1.0.   IMPRESSIONS - Mild obstructive sleep apnea that does not require positive pressure treatment. 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 No significant associated arousals.   Delano Metz, MD Diplomate, American Board of Sleep Medicine.

## 2015-11-08 ENCOUNTER — Ambulatory Visit: Payer: Medicare Other | Attending: Anesthesiology | Admitting: Physical Therapy

## 2015-11-08 DIAGNOSIS — M545 Low back pain: Secondary | ICD-10-CM | POA: Insufficient documentation

## 2015-11-08 DIAGNOSIS — R293 Abnormal posture: Secondary | ICD-10-CM | POA: Diagnosis present

## 2015-11-08 DIAGNOSIS — G8929 Other chronic pain: Secondary | ICD-10-CM | POA: Insufficient documentation

## 2015-11-08 NOTE — Therapy (Signed)
[facility] [place], [place], [address] Phone: [phone]   Fax:  [phone]  Physical Therapy Treatment  Patient Details  Name: Steven Ayala MRN: [id number] Date of Birth: [birth year] Referring Provider: Shanon Ace MD  Encounter Date: 11/08/2015      PT End of Session - 11/08/15 1102    Visit Number 1   Number of Visits 12   Date for PT Re-Evaluation 01/07/16   PT Start Time 1033   PT Stop Time 1124   PT Time Calculation (min) 51 min   Activity Tolerance Patient tolerated treatment well   Behavior During Therapy [facility] for tasks assessed/performed      Past Medical History:  Diagnosis Date  . Anxiety   . Asthma   . Blind    left eye  . Chronic back pain   . Chronic pain   . COPD (chronic obstructive pulmonary disease)   . Depression   . Hypertension     Past Surgical History:  Procedure Laterality Date  . BACK SURGERY    . COLONOSCOPY N/A 04/18/2012   Procedure: COLONOSCOPY;  Surgeon: Rogene Houston, MD;  Location: [facility];  Service: Endoscopy;  Laterality: N/A;  1200-rescheduled to 9:30 Ann notified pt  . HAND SURGERY     right  . ROTATOR CUFF REPAIR     right  . TONSILLECTOMY      There were no vitals filed for this visit.      Subjective Assessment - 11/08/15 1112    Subjective The patient began having back pain in 1986 and subsquent surgery and then another surgery in June of 1999.  The patient reports daily back pain with a pain rating today of 7/10. Patient has been through physical therapy many times with minimal improvement. The patient reports pain radiation into his left posterior thigh to his feet.   Limitations Sitting;Standing;Walking   How long can you sit comfortably? 10-15 minutes.   How long can you stand comfortably? 10-15 minutes.   How long can you walk comfortably? Short distances.   Patient Stated Goals Reduce pain.            [facility] PT Assessment -  11/08/15 0001      Assessment   Medical Diagnosis Lumbago.   Referring Provider Shanon Ace MD   Onset Date/Surgical Date --  55.   Next MD Visit --  11/09/15.     Precautions   Precautions None     Restrictions   Weight Bearing Restrictions No     Balance Screen   Has the patient fallen in the past 6 months Yes   Has the patient had a decrease in activity level because of a fear of falling?  Yes   Is the patient reluctant to leave their home because of a fear of falling?  No     Home Environment   Living Environment Private residence     Prior Function   Level of Independence Independent     Posture/Postural Control   Posture/Postural Control Postural limitations   Postural Limitations Rounded Shoulders;Forward head     ROM / Strength   AROM / PROM / Strength AROM;Strength     AROM   Overall AROM Comments Active lumbar extension= 15 degrees and flexion limited by 50%.     Strength   Overall Strength Comments Normal LE strength.     Palpation   Palpation comment Patient CC is with palpation directly over  L4  to L5-S1.     Special Tests    Special Tests Lumbar;Leg LengthTest  Negative SLR testing.   Lumbar Tests --  Absent bilateral Achilles DTR's.   Leg length test  --  Equal leg lengths.     Ambulation/Gait   Gait Comments WNL.                     [facility] Adult PT Treatment/Exercise - 11/20/2015 0001      Modalities   Modalities Electrical Stimulation     Electrical Stimulation   Electrical Stimulation Location Low back.   Electrical Stimulation Action Pre-mod   Electrical Stimulation Parameters Constant 80-150 Hz x 20 minutes.   Electrical Stimulation Goals Pain                  PT Short Term Goals - 2015-11-20 1113      PT SHORT TERM GOAL #1   Title STG's=LTG's.           PT Long Term Goals - 2015-11-20 1113      PT LONG TERM GOAL #1   Title Independent with a HEP.   Time 8   Period Weeks   Status New      PT LONG TERM GOAL #2   Title Perform ADL's with pain not > 4/10.   Time 8   Period Weeks   Status New     PT LONG TERM GOAL #3   Title Sit 20 minutes with pain not > 4/10.   Time 8   Period Weeks   Status New     PT LONG TERM GOAL #4   Title Eliminate left LE symptoms.   Time 8   Period Weeks   Status New               Plan - Nov 20, 2015 1110    Clinical Impression Statement The patient presents with a 7/10 low back pain-level today with loss of lumbar motion and pain increasing with most all ADL's.  His bilateral Achilles reflexes could not be elicited.  He reports pain radiatinto into his left posterior thigh to foot.   Rehab Potential Fair   PT Frequency 2x / week   PT Duration 6 weeks   PT Treatment/Interventions ADLs/Self Care Home Management;Cryotherapy;Electrical Stimulation;Moist Heat;Ultrasound;Therapeutic activities;Therapeutic exercise;Patient/family education;Manual techniques   PT Next Visit Plan Begin with low-level core exercises and modalites as needed.      Patient will benefit from skilled therapeutic intervention in order to improve the following deficits and impairments:  Pain, Decreased activity tolerance, Decreased range of motion  Visit Diagnosis: Chronic midline low back pain, with sciatica presence unspecified - Plan: PT plan of care cert/re-cert  Abnormal posture - Plan: PT plan of care cert/re-cert       G-Codes - November 20, 2015 1032    Functional Assessment Tool Used 53% limitation.   Functional Limitation Mobility: Walking and moving around   Mobility: Walking and Moving Around Current Status [phone]) At least 40 percent but less than 60 percent impaired, limited or restricted   Mobility: Walking and Moving Around Goal Status [phone]) At least 20 percent but less than 40 percent impaired, limited or restricted      Problem List There are no active problems to display for this patient.   Banner Huckaba, [place] 20-Nov-2015, 11:39 AM  [facility] [address] [place], [place], [address] Phone: [phone]   Fax:  [phone]  Name: Steven Ayala MRN: [id number] Date  of Birth: Apr 20, 1945

## 2017-04-18 ENCOUNTER — Encounter (INDEPENDENT_AMBULATORY_CARE_PROVIDER_SITE_OTHER): Payer: Self-pay | Admitting: *Deleted

## 2017-06-30 DEATH — deceased
